# Patient Record
Sex: Male | Born: 1980 | Race: Black or African American | Hispanic: No | Marital: Single | Smoking: Current every day smoker
Health system: Southern US, Community
[De-identification: ages and names within clinical notes are randomized; demographics above are authoritative.]

---

## 2007-08-19 ENCOUNTER — Emergency Department (HOSPITAL_COMMUNITY): Admission: EM | Admit: 2007-08-19 | Discharge: 2007-08-19 | Payer: Self-pay | Admitting: Emergency Medicine

## 2007-08-27 ENCOUNTER — Emergency Department (HOSPITAL_COMMUNITY): Admission: EM | Admit: 2007-08-27 | Discharge: 2007-08-28 | Payer: Self-pay | Admitting: Emergency Medicine

## 2008-11-15 ENCOUNTER — Emergency Department (HOSPITAL_COMMUNITY): Admission: EM | Admit: 2008-11-15 | Discharge: 2008-11-15 | Payer: Self-pay | Admitting: Emergency Medicine

## 2008-12-22 ENCOUNTER — Observation Stay (HOSPITAL_COMMUNITY): Admission: EM | Admit: 2008-12-22 | Discharge: 2008-12-22 | Payer: Self-pay | Admitting: Emergency Medicine

## 2010-04-09 IMAGING — CR DG CHEST 1V PORT
1 series · 1 of 1 positions shown · non-contrast
Comparison: [DATE]

CLINICAL DATA: Chest pain

PORTABLE CHEST - 1 VIEW

[AP]
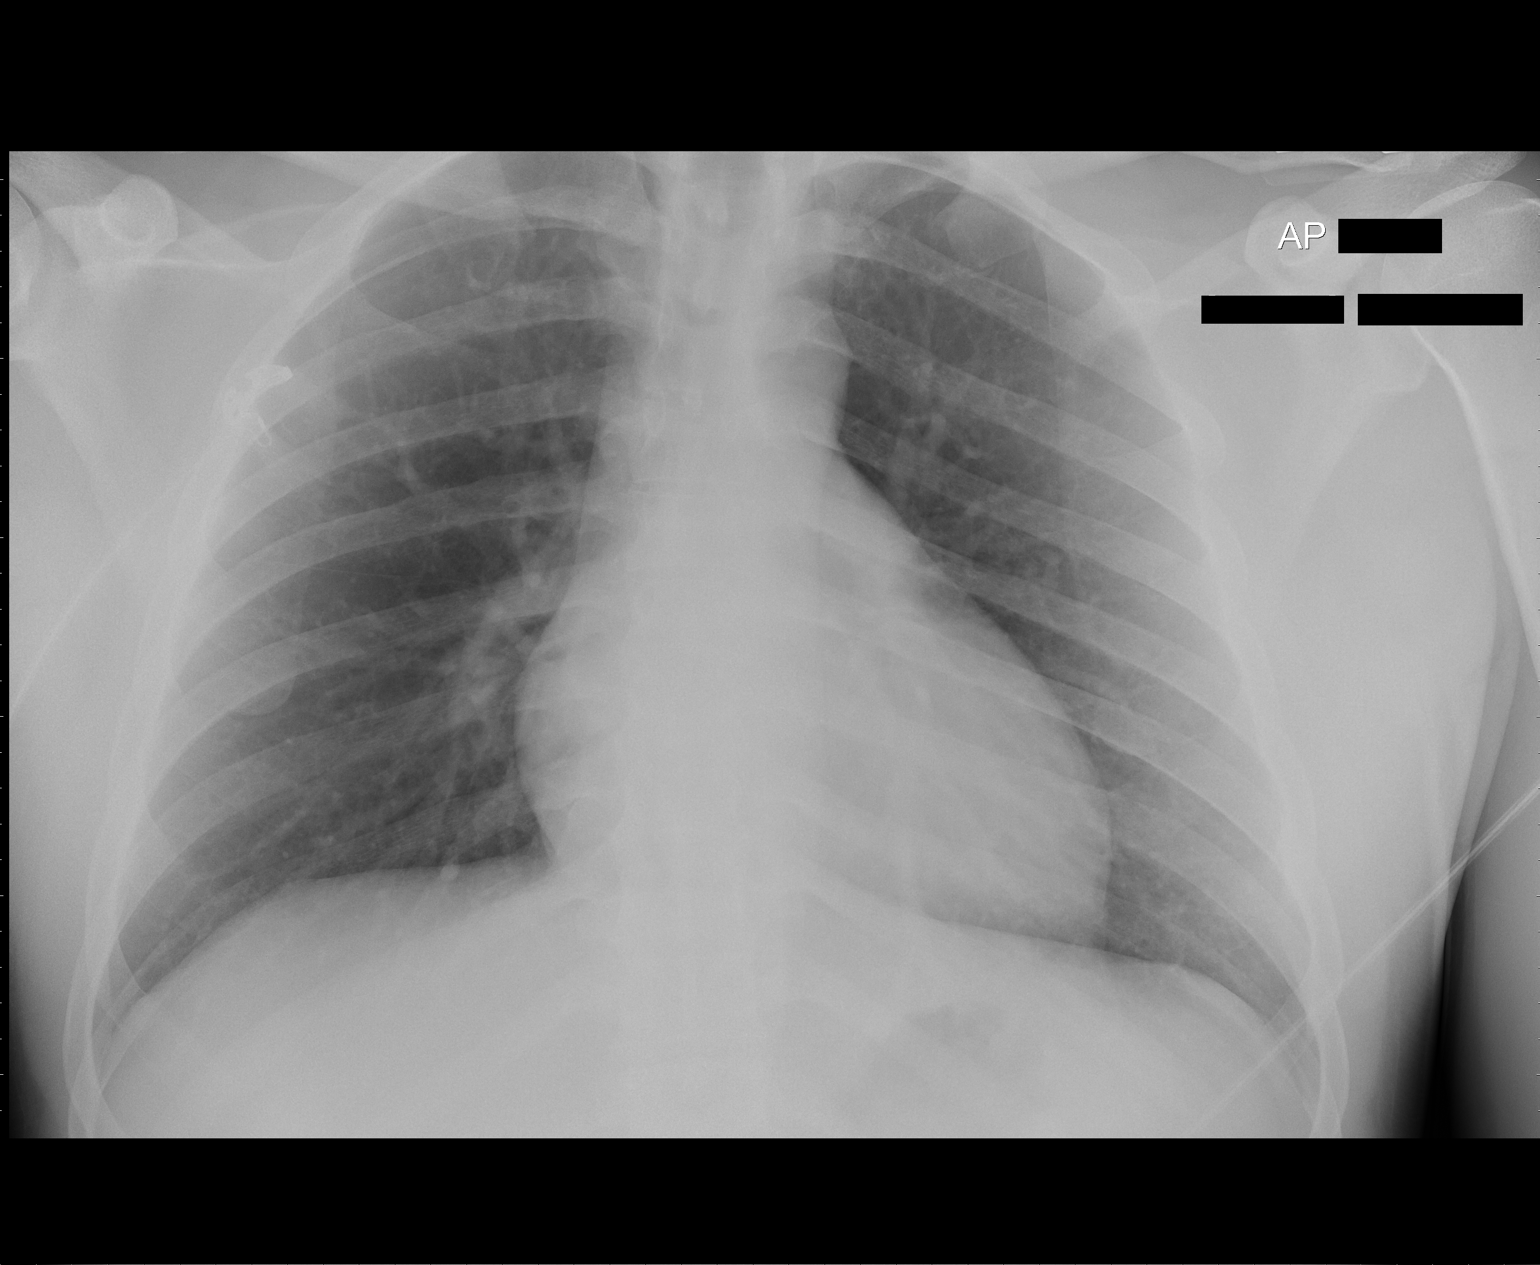

[1 of 1 positions shown; findings below may reference images not displayed]

FINDINGS: Heart size upper limits normal.  Lungs are clear.  No
effusion.  Visualized bones unremarkable.
IMPRESSION: No acute disease

## 2010-05-22 LAB — CBC
MCHC: 33.8 g/dL (ref 30.0–36.0)
MCV: 90.9 fL (ref 78.0–100.0)
Platelets: 242 10*3/uL (ref 150–400)
RDW: 14.2 % (ref 11.5–15.5)

## 2010-05-22 LAB — POCT CARDIAC MARKERS
Myoglobin, poc: 80.2 ng/mL (ref 12–200)
Myoglobin, poc: 87.3 ng/mL (ref 12–200)

## 2010-05-22 LAB — DIFFERENTIAL
Basophils Absolute: 0.1 10*3/uL (ref 0.0–0.1)
Basophils Relative: 1 % (ref 0–1)
Eosinophils Absolute: 0 10*3/uL (ref 0.0–0.7)
Neutro Abs: 8.3 10*3/uL — ABNORMAL HIGH (ref 1.7–7.7)
Neutrophils Relative %: 80 % — ABNORMAL HIGH (ref 43–77)

## 2010-05-22 LAB — BASIC METABOLIC PANEL
BUN: 8 mg/dL (ref 6–23)
CO2: 27 mEq/L (ref 19–32)
Calcium: 9.4 mg/dL (ref 8.4–10.5)
Chloride: 103 mEq/L (ref 96–112)
Creatinine, Ser: 1.19 mg/dL (ref 0.4–1.5)

## 2010-11-14 LAB — COMPREHENSIVE METABOLIC PANEL
AST: 26
CO2: 27
Calcium: 9.3
Creatinine, Ser: 1.35
GFR calc Af Amer: >60
GFR calc non Af Amer: >60
Total Protein: 6.4

## 2010-11-14 LAB — RAPID URINE DRUG SCREEN, HOSP PERFORMED
Amphetamines: NOT DETECTED
Barbiturates: NOT DETECTED
Benzodiazepines: NOT DETECTED
Cocaine: NOT DETECTED
Opiates: NOT DETECTED

## 2010-11-14 LAB — DIFFERENTIAL
Lymphocytes Relative: 35
Lymphs Abs: 2.4
Neutrophils Relative %: 54

## 2010-11-14 LAB — CBC
MCHC: 33.4
MCV: 89
RBC: 5.28
RDW: 13.8

## 2019-09-17 ENCOUNTER — Encounter (HOSPITAL_COMMUNITY): Payer: Self-pay | Admitting: Emergency Medicine

## 2019-09-17 ENCOUNTER — Emergency Department (HOSPITAL_COMMUNITY): Payer: Self-pay

## 2019-09-17 ENCOUNTER — Other Ambulatory Visit: Payer: Self-pay

## 2019-09-17 ENCOUNTER — Emergency Department (HOSPITAL_COMMUNITY)
Admission: EM | Admit: 2019-09-17 | Discharge: 2019-09-17 | Disposition: A | Discharge status: Home / Self Care | Payer: Self-pay | Attending: Emergency Medicine | Admitting: Emergency Medicine

## 2019-09-17 DIAGNOSIS — Z5321 Procedure and treatment not carried out due to patient leaving prior to being seen by health care provider: Secondary | ICD-10-CM | POA: Insufficient documentation

## 2019-09-17 DIAGNOSIS — R000 Tachycardia, unspecified: Secondary | ICD-10-CM | POA: Insufficient documentation

## 2019-09-17 DIAGNOSIS — R0789 Other chest pain: Secondary | ICD-10-CM | POA: Insufficient documentation

## 2019-09-17 LAB — BASIC METABOLIC PANEL
Anion gap: 14 (ref 5–15)
BUN: 15 mg/dL (ref 6–20)
CO2: 22 mmol/L (ref 22–32)
Calcium: 9.5 mg/dL (ref 8.9–10.3)
Chloride: 103 mmol/L (ref 98–111)
Creatinine, Ser: 1.48 mg/dL — ABNORMAL HIGH (ref 0.61–1.24)
GFR calc Af Amer: >60 mL/min (ref >60)
GFR calc non Af Amer: 58 mL/min — ABNORMAL LOW (ref >60)
Glucose, Bld: 212 mg/dL — ABNORMAL HIGH (ref 70–99)
Potassium: 3.6 mmol/L (ref 3.5–5.1)
Sodium: 139 mmol/L (ref 135–145)

## 2019-09-17 LAB — TROPONIN I (HIGH SENSITIVITY): Troponin I (High Sensitivity): 5 ng/L (ref <18)

## 2019-09-17 LAB — CBC
HCT: 48.6 % (ref 39.0–52.0)
Hemoglobin: 15.4 g/dL (ref 13.0–17.0)
MCH: 29.1 pg (ref 26.0–34.0)
MCHC: 31.7 g/dL (ref 30.0–36.0)
MCV: 91.7 fL (ref 80.0–100.0)
Platelets: 289 10*3/uL (ref 150–400)
RBC: 5.3 MIL/uL (ref 4.22–5.81)
RDW: 13.5 % (ref 11.5–15.5)
WBC: 9.3 10*3/uL (ref 4.0–10.5)
nRBC: 0 % (ref 0.0–0.2)

## 2019-09-17 MED ORDER — SODIUM CHLORIDE 0.9% FLUSH: 3.0000 mL | Freq: Once | INTRAVENOUS | Status: DC

## 2019-09-17 NOTE — ED Triage Notes (Signed)
Pt in w/racing HR, some chest tightness. States he used cocaine at 0200 this am. HR 114 in triage, denies n/v or sob

## 2019-09-17 NOTE — ED Notes (Signed)
Pt called for vitals with no response. 

## 2019-09-17 NOTE — ED Notes (Signed)
Pt called for vitals with  No response x2

## 2019-11-17 ENCOUNTER — Ambulatory Visit (INDEPENDENT_AMBULATORY_CARE_PROVIDER_SITE_OTHER): Payer: No Payment, Other | Admitting: Behavioral Health

## 2019-11-17 ENCOUNTER — Other Ambulatory Visit: Payer: Self-pay

## 2019-11-17 DIAGNOSIS — F1420 Cocaine dependence, uncomplicated: Secondary | ICD-10-CM
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
## 2019-11-24 ENCOUNTER — Other Ambulatory Visit: Payer: Self-pay

## 2019-11-24 ENCOUNTER — Ambulatory Visit (INDEPENDENT_AMBULATORY_CARE_PROVIDER_SITE_OTHER): Payer: No Payment, Other | Admitting: Behavioral Health

## 2019-11-24 DIAGNOSIS — F1020 Alcohol dependence, uncomplicated: Secondary | ICD-10-CM

## 2019-11-24 DIAGNOSIS — F1410 Cocaine abuse, uncomplicated: Secondary | ICD-10-CM

## 2019-11-24 NOTE — Progress Notes (Signed)
   THERAPIST PROGRESS NOTE  Session Time: 1:00PM  Participation Level: Active  Behavioral Response: Well GroomedAlertPleasant  Type of Therapy: Individual Therapy  Treatment Goals addressed: Diagnosis: Cocaine Use Disorder  Interventions: Motivational Interviewing  Summary: RONN SMOLINSKY is a 39 y.o. male who presents to [facility] for a scheduled individual therapy session. At the beginning of session, client completed the AUDIT screening tool and the URICA screening tool. On the AUDIT screening tool client scored a 23 which indicates hazardous or harmful alcohol use. Client process the pros and cons of changing vs the pros and cons of not changing. Client struggles with accepting his cocaine addiction. He reported to this writer that when he drinks alcohol it triggers him to use cocaine. Client was very indecisive in his goal to change stating "living a sober life seems to be boring". Client reported he often drinks alcohol when he is trying to "escape" the stressors of his relationship with his child's mother. He further shared that his family is concerned with his safety and wellbeing and have encouraged him to seek therapy.  Suicidal/Homicidal: No  Therapist Response: Therapist encouraged client to identify his intrinsic motivations to change. Therapist used motivational interviewing, confrontation, and exploration to assist client with processing his treatment goals. Therapist encouraged client to identify his triggers to use alcohol.  Plan: Return again in  weeks.  Diagnosis: Axis I: Substance Abuse    Axis II: No diagnosis    Mamie Nick, Counselor 11/24/2019

## 2019-11-29 ENCOUNTER — Ambulatory Visit (HOSPITAL_COMMUNITY): Payer: No Payment, Other | Admitting: Behavioral Health

## 2021-01-02 IMAGING — DX DG CHEST 2V
2 series · 2 of 2 positions shown · non-contrast
Comparison: 12/22/2008.

CLINICAL DATA: 39-year-old presenting with palpitations after
admitting to illicit drug abuse at 2 o'clock this morning. Current
smoker.

EXAM:
CHEST - 2 VIEW

[w chest pa]
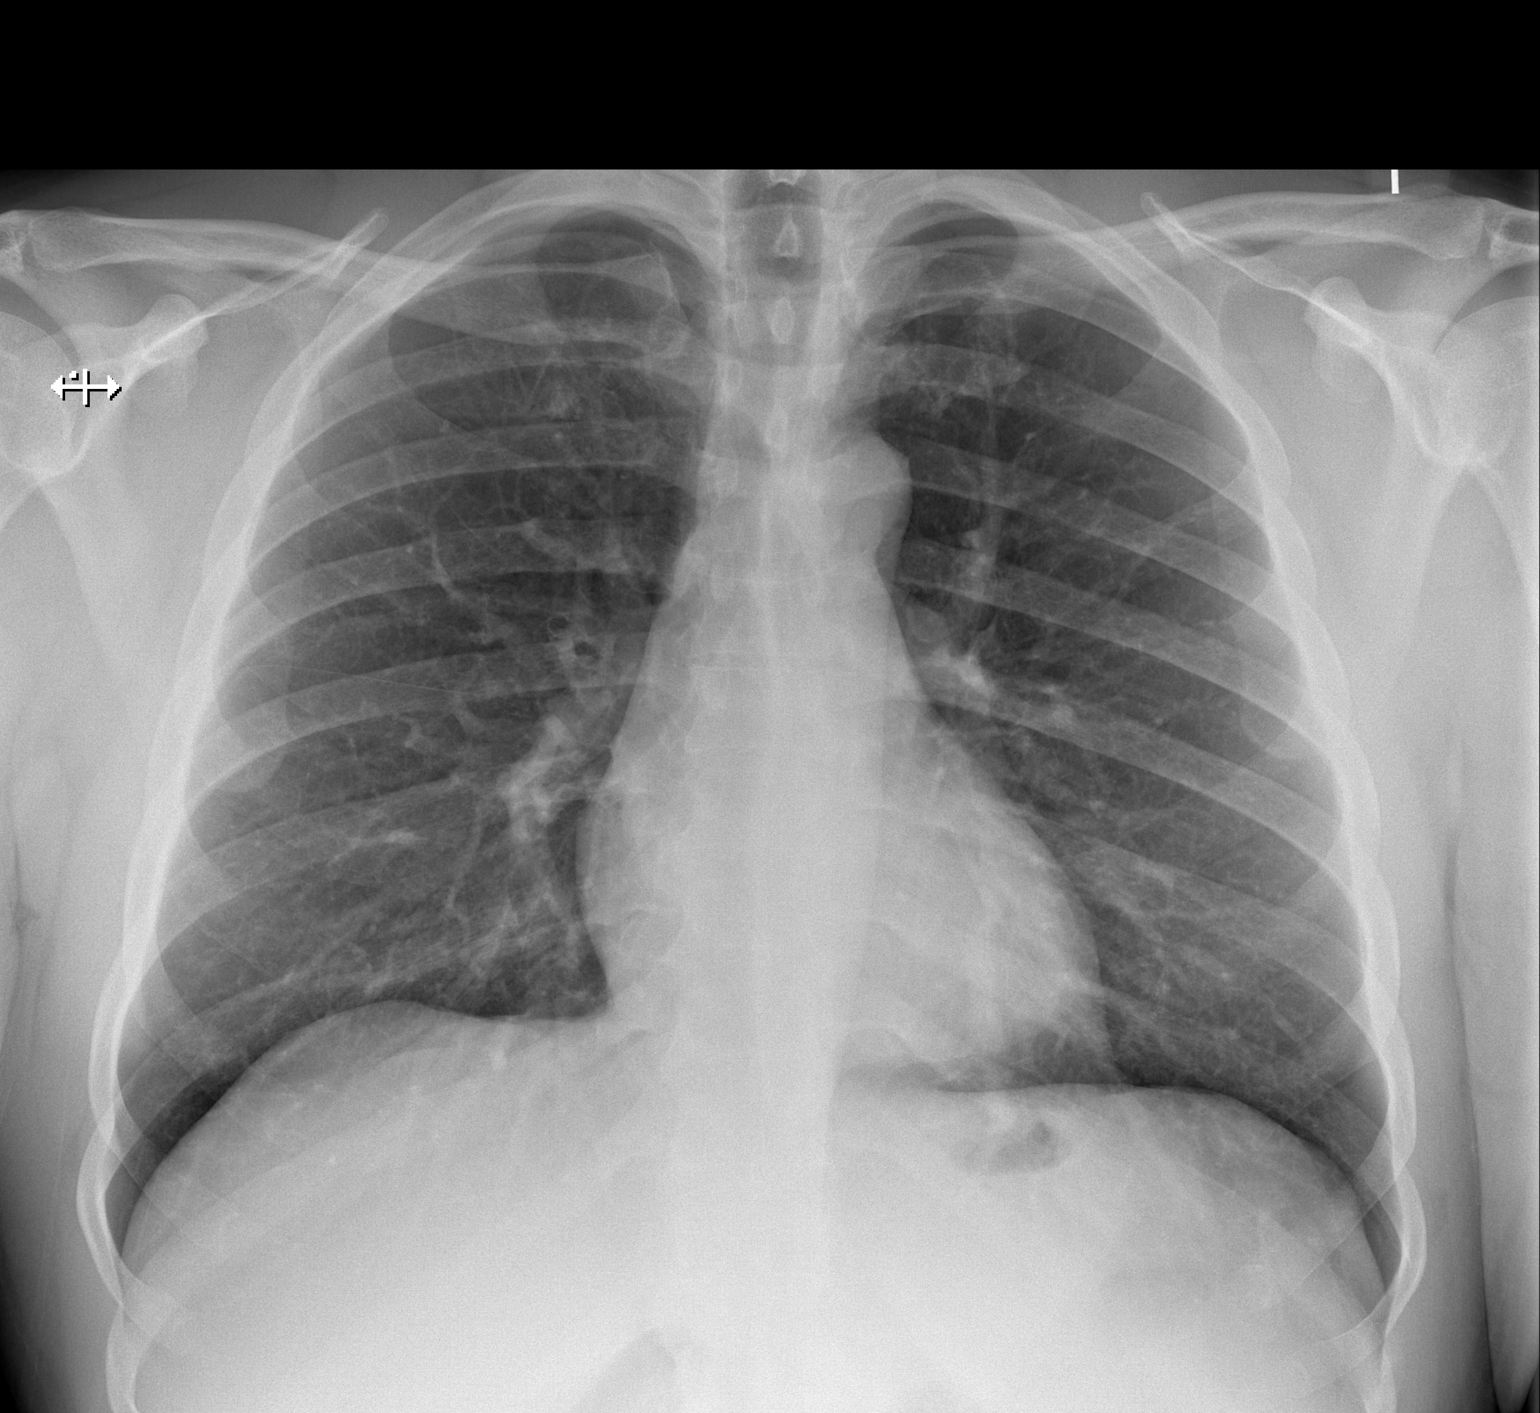

[w chest lat]
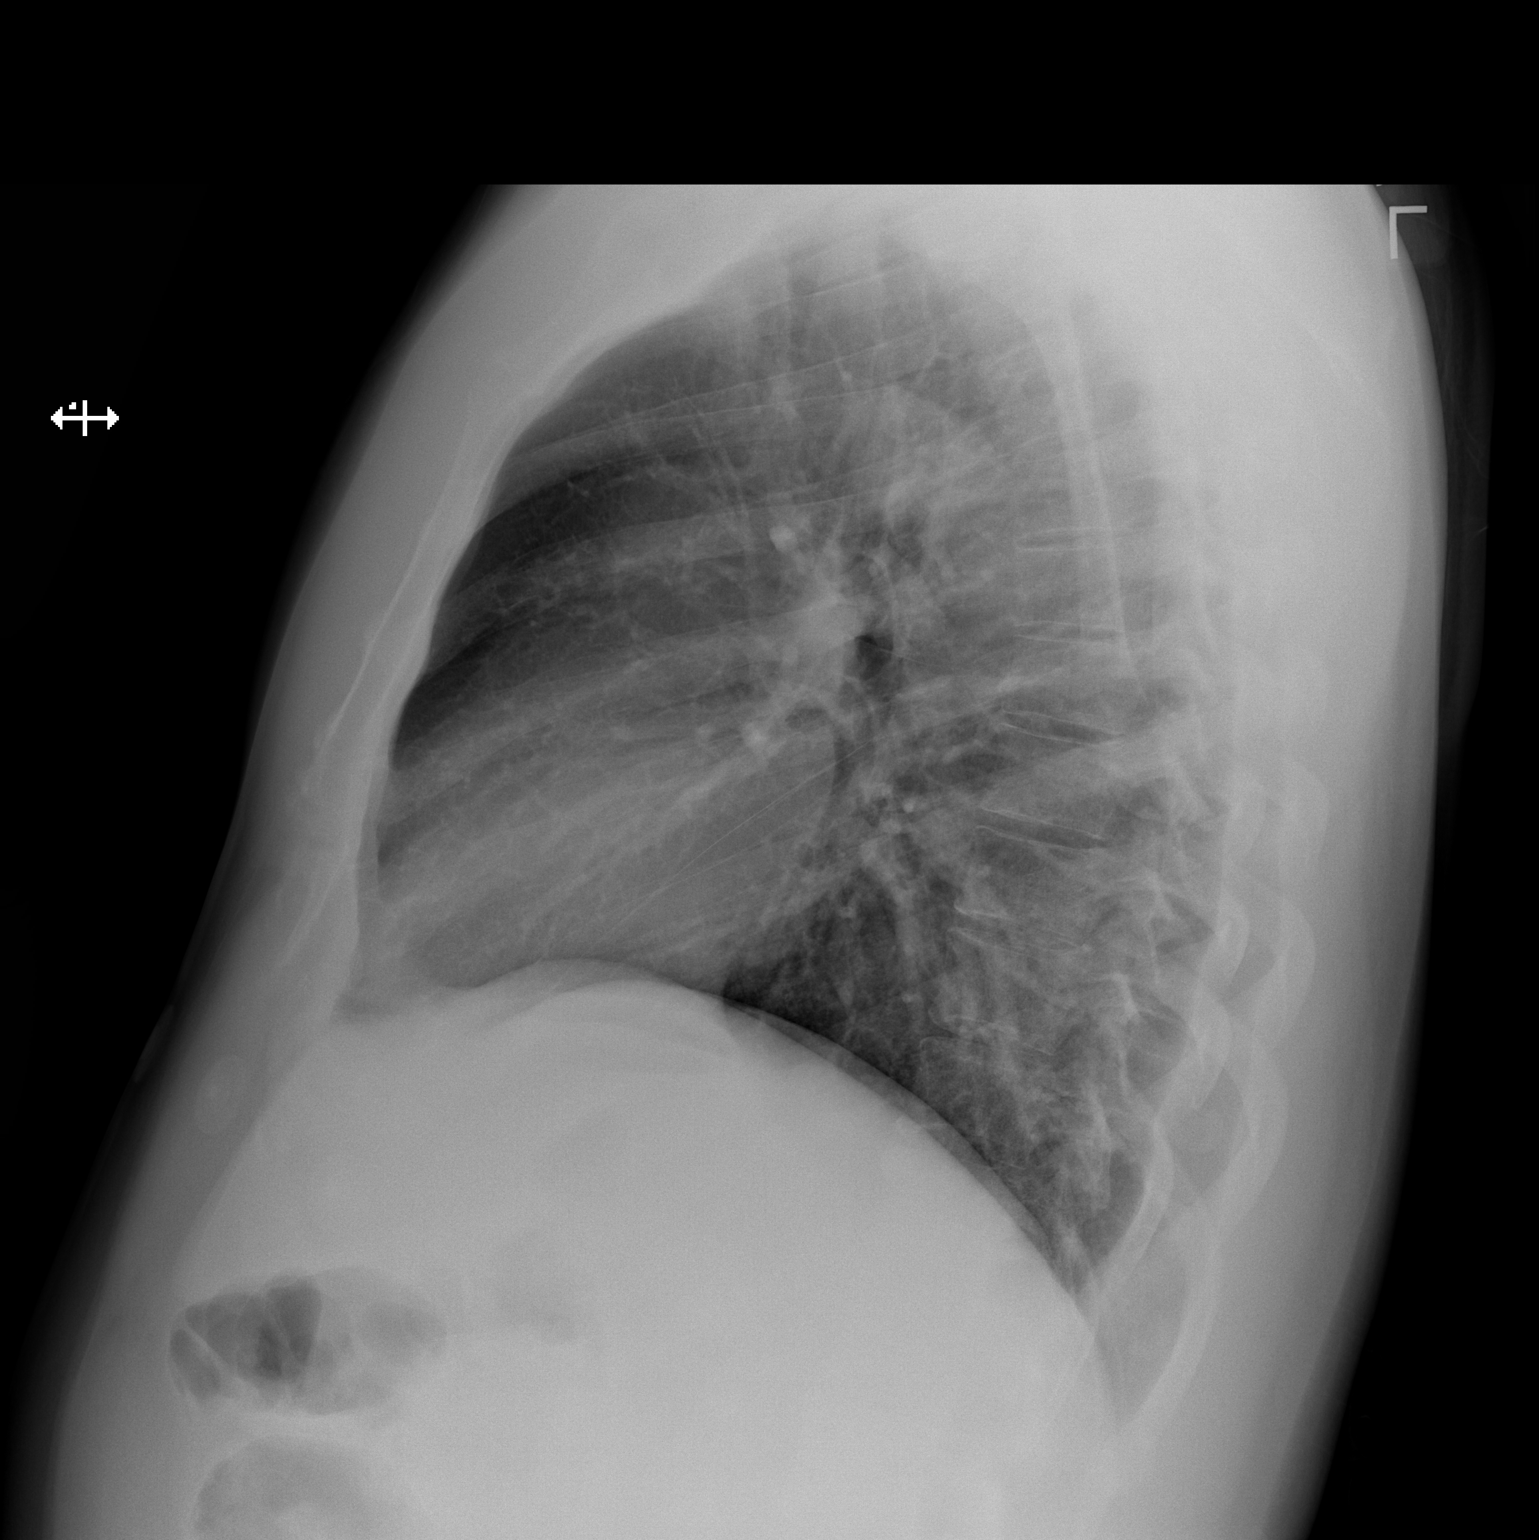

[2 of 2 positions shown; findings below may reference images not displayed]

FINDINGS: Cardiomediastinal silhouette unremarkable. Lungs clear.
Bronchovascular markings normal. Pulmonary vascularity normal. No
pneumothorax. No pleural effusions. Visualized bony thorax intact.
IMPRESSION: Normal examination.

## 2021-02-14 DIAGNOSIS — F17200 Nicotine dependence, unspecified, uncomplicated: Secondary | ICD-10-CM | POA: Diagnosis not present

## 2021-02-14 DIAGNOSIS — Z13220 Encounter for screening for lipoid disorders: Secondary | ICD-10-CM | POA: Diagnosis not present

## 2021-02-14 DIAGNOSIS — Z23 Encounter for immunization: Secondary | ICD-10-CM | POA: Diagnosis not present

## 2021-02-14 DIAGNOSIS — F1010 Alcohol abuse, uncomplicated: Secondary | ICD-10-CM | POA: Diagnosis not present

## 2021-02-14 DIAGNOSIS — Z202 Contact with and (suspected) exposure to infections with a predominantly sexual mode of transmission: Secondary | ICD-10-CM | POA: Diagnosis not present

## 2021-02-14 DIAGNOSIS — R030 Elevated blood-pressure reading, without diagnosis of hypertension: Secondary | ICD-10-CM | POA: Diagnosis not present

## 2021-02-14 DIAGNOSIS — Z0000 Encounter for general adult medical examination without abnormal findings: Secondary | ICD-10-CM | POA: Diagnosis not present

## 2021-03-14 DIAGNOSIS — R7309 Other abnormal glucose: Secondary | ICD-10-CM | POA: Diagnosis not present

## 2021-03-14 DIAGNOSIS — E782 Mixed hyperlipidemia: Secondary | ICD-10-CM | POA: Diagnosis not present

## 2021-03-14 DIAGNOSIS — R030 Elevated blood-pressure reading, without diagnosis of hypertension: Secondary | ICD-10-CM | POA: Diagnosis not present

## 2021-06-27 DIAGNOSIS — F4310 Post-traumatic stress disorder, unspecified: Secondary | ICD-10-CM | POA: Diagnosis not present

## 2021-07-03 DIAGNOSIS — F1420 Cocaine dependence, uncomplicated: Secondary | ICD-10-CM | POA: Diagnosis not present

## 2021-07-03 DIAGNOSIS — F1020 Alcohol dependence, uncomplicated: Secondary | ICD-10-CM | POA: Diagnosis not present

## 2021-07-16 DIAGNOSIS — F1020 Alcohol dependence, uncomplicated: Secondary | ICD-10-CM | POA: Diagnosis not present

## 2021-07-16 DIAGNOSIS — F1420 Cocaine dependence, uncomplicated: Secondary | ICD-10-CM | POA: Diagnosis not present

## 2021-07-17 DIAGNOSIS — F1420 Cocaine dependence, uncomplicated: Secondary | ICD-10-CM | POA: Diagnosis not present

## 2021-07-17 DIAGNOSIS — F1020 Alcohol dependence, uncomplicated: Secondary | ICD-10-CM | POA: Diagnosis not present

## 2021-07-18 DIAGNOSIS — F1420 Cocaine dependence, uncomplicated: Secondary | ICD-10-CM | POA: Diagnosis not present

## 2021-07-18 DIAGNOSIS — F1020 Alcohol dependence, uncomplicated: Secondary | ICD-10-CM | POA: Diagnosis not present

## 2021-07-19 DIAGNOSIS — F1020 Alcohol dependence, uncomplicated: Secondary | ICD-10-CM | POA: Diagnosis not present

## 2021-07-19 DIAGNOSIS — F1420 Cocaine dependence, uncomplicated: Secondary | ICD-10-CM | POA: Diagnosis not present

## 2021-07-22 DIAGNOSIS — F1420 Cocaine dependence, uncomplicated: Secondary | ICD-10-CM | POA: Diagnosis not present

## 2021-07-22 DIAGNOSIS — F1020 Alcohol dependence, uncomplicated: Secondary | ICD-10-CM | POA: Diagnosis not present

## 2021-07-23 DIAGNOSIS — F1020 Alcohol dependence, uncomplicated: Secondary | ICD-10-CM | POA: Diagnosis not present

## 2021-07-23 DIAGNOSIS — F1420 Cocaine dependence, uncomplicated: Secondary | ICD-10-CM | POA: Diagnosis not present

## 2021-07-24 DIAGNOSIS — F1020 Alcohol dependence, uncomplicated: Secondary | ICD-10-CM | POA: Diagnosis not present

## 2021-07-24 DIAGNOSIS — F1420 Cocaine dependence, uncomplicated: Secondary | ICD-10-CM | POA: Diagnosis not present

## 2021-07-25 DIAGNOSIS — F1020 Alcohol dependence, uncomplicated: Secondary | ICD-10-CM | POA: Diagnosis not present

## 2021-07-25 DIAGNOSIS — F1420 Cocaine dependence, uncomplicated: Secondary | ICD-10-CM | POA: Diagnosis not present

## 2021-07-25 DIAGNOSIS — F4310 Post-traumatic stress disorder, unspecified: Secondary | ICD-10-CM | POA: Diagnosis not present

## 2021-07-26 DIAGNOSIS — F1420 Cocaine dependence, uncomplicated: Secondary | ICD-10-CM | POA: Diagnosis not present

## 2021-07-26 DIAGNOSIS — F1020 Alcohol dependence, uncomplicated: Secondary | ICD-10-CM | POA: Diagnosis not present

## 2021-08-15 DIAGNOSIS — E782 Mixed hyperlipidemia: Secondary | ICD-10-CM | POA: Diagnosis not present

## 2021-08-15 DIAGNOSIS — R7309 Other abnormal glucose: Secondary | ICD-10-CM | POA: Diagnosis not present

## 2021-08-15 DIAGNOSIS — I10 Essential (primary) hypertension: Secondary | ICD-10-CM | POA: Diagnosis not present
